# Patient Record
Sex: Male | Born: 1995 | Race: White | Hispanic: Yes | Marital: Single
Health system: Southern US, Community
[De-identification: ages and names within clinical notes are randomized; demographics above are authoritative.]

## PROBLEM LIST (undated history)

## (undated) DIAGNOSIS — Z803 Family history of malignant neoplasm of breast: Secondary | ICD-10-CM

## (undated) DIAGNOSIS — Z800 Family history of malignant neoplasm of digestive organs: Secondary | ICD-10-CM

## (undated) HISTORY — DX: Family history of malignant neoplasm of breast: Z80.3

## (undated) HISTORY — DX: Family history of malignant neoplasm of digestive organs: Z80.0

---
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
## 2017-06-26 DIAGNOSIS — J029 Acute pharyngitis, unspecified: Secondary | ICD-10-CM | POA: Diagnosis not present

## 2017-09-06 DIAGNOSIS — Z23 Encounter for immunization: Secondary | ICD-10-CM | POA: Diagnosis not present

## 2018-08-08 DIAGNOSIS — Z23 Encounter for immunization: Secondary | ICD-10-CM | POA: Diagnosis not present

## 2018-11-24 ENCOUNTER — Other Ambulatory Visit: Payer: Self-pay

## 2018-11-26 ENCOUNTER — Inpatient Hospital Stay: Payer: BLUE CROSS/BLUE SHIELD | Attending: Genetic Counselor | Admitting: Genetic Counselor

## 2018-11-26 ENCOUNTER — Inpatient Hospital Stay: Payer: Self-pay

## 2018-11-26 ENCOUNTER — Encounter: Payer: Self-pay | Admitting: Genetic Counselor

## 2018-11-26 DIAGNOSIS — Z803 Family history of malignant neoplasm of breast: Secondary | ICD-10-CM | POA: Insufficient documentation

## 2018-11-26 DIAGNOSIS — Z800 Family history of malignant neoplasm of digestive organs: Secondary | ICD-10-CM

## 2018-11-26 DIAGNOSIS — Z1379 Encounter for other screening for genetic and chromosomal anomalies: Secondary | ICD-10-CM

## 2018-11-26 NOTE — Progress Notes (Signed)
REFERRING PROVIDER: No referring provider defined for this encounter.  PRIMARY PROVIDER:  No primary care provider on file.  PRIMARY REASON FOR VISIT:  1. Family history of breast cancer   2. Family history of pancreatic cancer      HISTORY OF PRESENT ILLNESS:   Ricky Woods, a 22 y.o. male, was seen for a [facility] cancer genetics consultation at the request of Dr. No ref. provider found due to a family history of cancer.  Ricky Woods presents to clinic today to discuss the possibility of a hereditary predisposition to cancer, genetic testing, and to further clarify his future cancer risks, as well as potential cancer risks for family members.   Ricky Woods is a 22 y.o. male with no personal history of cancer.  He is here with his mother and aunt to learn more about his mother's genetic test result.  CANCER HISTORY:   No history exists.     Past Medical History:  Diagnosis Date  . Family history of breast cancer   . Family history of pancreatic cancer     Social History   Socioeconomic History  . Marital status: Single    Spouse name: Not on file  . Number of children: Not on file  . Years of education: Not on file  . Highest education level: Not on file  Occupational History  . Not on file  Social Needs  . Financial resource strain: Not on file  . Food insecurity:    Worry: Not on file    Inability: Not on file  . Transportation needs:    Medical: Not on file    Non-medical: Not on file  Tobacco Use  . Smoking status: Not on file  Substance and Sexual Activity  . Alcohol use: Not on file  . Drug use: Not on file  . Sexual activity: Not on file  Lifestyle  . Physical activity:    Days per week: Not on file    Minutes per session: Not on file  . Stress: Not on file  Relationships  . Social connections:    Talks on phone: Not on file    Gets together: Not on file    Attends religious service: Not on file    Active member of club or  organization: Not on file    Attends meetings of clubs or organizations: Not on file    Relationship status: Not on file  Other Topics Concern  . Not on file  Social History Narrative  . Not on file     FAMILY HISTORY:  We obtained a detailed, 4-generation family history.  Significant diagnoses are listed below: Family History  Problem Relation Age of Onset  . Breast cancer Mother 9  . Kidney cancer Maternal Uncle 61       d. 65  . Pancreatic cancer Maternal Grandmother 52  . Heart attack Maternal Grandfather   . Breast cancer Paternal Grandmother        dx in her 21s, and then a second time as well  . Pancreatic cancer Paternal Grandfather     The patient does not have children or siblings.  His parents are both living.    The patient's mother was diagnosed with breast cancer at 54.  She had genetic testing which found multiple VUS and a likely pathogenic mutation in [place], which is causative for Hereditary Leiomyomatosis and Renal Cell Carcinoma.  She has a full brother and sister and two paternal half sisters.  Her brother  died of kidney cancer at age 30.  The patient's maternal grandmother died of pancreatic cancer and the grandfather died of a heart attack.  The patient's father is living and healthy.  He has two brothers and sister and a paternal half sister.  None have cancer.  The patient's mother developed breast cancer in her 41's and again later in life and died at 2.  The grandfather died at 9 with pancreatic cancer.  Patient's maternal ancestors are of [place] descent, and paternal ancestors are of [place] descent. There is no reported Ashkenazi Jewish ancestry. There is no known consanguinity.  GENETIC COUNSELING ASSESSMENT: Ricky Woods is a 22 y.o. 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 We, therefore, discussed and recommended the following at today's visit.   DISCUSSION: Hereditary leiomyomatosis and renal cell cancer (HLRCC) is acondition that causesaffected individuals to develop benign leiomyomas in the skin and the uterus (fibroids). This condition also increases the risk ofrenal cancer, specifically Papillary type 2 renal cancer.  Cutaneous leiomyomas- typically develop in the third decade of life. These arebenign lesions that can be painful forsome individuals. The number of cutaneous leiomyomas varies greatly among individuals but tends to increase in size and number over time.   Uterine Leiomyomas-Most women with HLRCC also develop uterine leiomyomas (fibroids). While uterine fibroids are very common in the general population, women with HLRCC tend to have numerous large fibroids that appear earlier than in the general population.  Approximately10%-16%percent of people with HLRCC develop renal cell cancer. The average age of diagnosis of renal cancer in people with HLRCCis in their forties, however diagnosis as early as childhood has been reported.Most tumorsseen with this condition aretype 2 papillary renal cancer. However, other types of renal tumorshave been reportedincluding tubulo-papillary renal cell carcinomasandcollecting-duct renal cell carcinomas.The renal cancers described in [place] patients also tend to be more aggressive.  It is important that all of Ms. Woods'srelatives (both men and women) know of the presence of this gene mutation. Site-specific genetic testing can sort out who in thefamily is at risk and who is not.Ricky Woods has a 50% chance of having this same FH mutation.  If he tests positive we would want to start to image his kidneys to screen for kidney cancer.  We also discussed the family history of breast and pancreatic cancer on the paternal side of the family.  We discussed that about  5-10% of breast cancer and 5-10% of pancreatic cancer is hereditary.  In both cases, the most common cause of the cancer is due to genetic changes in BRCA1 or BRCA2.  There are other genes that can increase the risk for either cancer.  Based on the young age of onset of breast cancer in his grandmother, Ricky Woods is eligible for genetic testing for hereditary breast and pancreatic cancer syndromes, in addition to the Woods mutation identified in his mother.  Even though he is eligible, he is not the most informative person in the family to be tested.  His father would be a better person to test as he is closer in relation to the grandparents, to learn about hereditary breast and pancreatic cancer syndromes.  Ricky Woods would still need to be tested for FH in order to learn about his risk for HLRCC.  We reviewed the characteristics, features and inheritance patterns of hereditary  cancer syndromes. We also discussed genetic testing, including the appropriate family members to test, the process of testing, insurance coverage and turn-around-time for results. We discussed the implications of a negative, positive and/or variant of uncertain significant result. We recommended Ricky Woods pursue genetic testing for the multi cancer gene panel. The Multi-Gene Panel offered by [facility] includes sequencing and/or deletion duplication testing of the following 84 genes: AIP, ALK, APC, ATM, AXIN2,BAP1,  BARD1, BLM, BMPR1A, BRCA1, BRCA2, BRIP1, CASR, CDC73, CDH1, CDK4, CDKN1B, CDKN1C, CDKN2A (p14ARF), CDKN2A (p16INK4a), CEBPA, CHEK2, CTNNA1, DICER1, DIS3L2, EGFR (c.2369C>T, p.Thr790Met variant only), EPCAM (Deletion/duplication testing only), FH, FLCN, GATA2, GPC3, GREM1 (Promoter region deletion/duplication testing only), HOXB13 (c.251G>A, p.Gly84Glu), HRAS, KIT, MAX, MEN1, MET, MITF (c.952G>A, p.Glu318Lys variant only), MLH1, MSH2, MSH3, MSH6, MUTYH, NBN, NF1, NF2, NTHL1, PALB2, PDGFRA, PHOX2B, PMS2, POLD1,  POLE, POT1, PRKAR1A, PTCH1, PTEN, RAD50, RAD51C, RAD51D, RB1, RECQL4, RET, RUNX1, SDHAF2, SDHA (sequence changes only), SDHB, SDHC, SDHD, SMAD4, SMARCA4, SMARCB1, SMARCE1, STK11, SUFU, TERC, TERT, TMEM127, TP53, TSC1, TSC2, VHL, WRN and WT1.    Alternatively, he could be tested for the FH mutation as well as the VUS identified in his mother.  We discussed that some people do not want to undergo genetic testing due to fear of genetic discrimination.  A federal law called the Genetic Information Non-Discrimination Act (GINA) of 2008 helps protect individuals against genetic discrimination based on their genetic test results.  It impacts both health insurance and employment.  With health insurance, it protects against increased premiums, being kicked off insurance or being forced to take a test in order to be insured.  For employment it protects against hiring, firing and promoting decisions based on genetic test results.  Health status due to a cancer diagnosis is not protected under GINA.   Based on Ricky Woods's family history of cancer, he meets medical criteria for genetic testing. If he undergoes genetic testing for the FH mutation and the VUS's that were identified in his mother, genetic testing will be covered by the testing laboratory under their family testing policy.  If he decides to be tested for the cancers on his paternal side of the family as well, this would need to go through insurance.  Despite that he meets criteria, he may still have an out of pocket cost. We discussed that if his out of pocket cost for testing is over $100, the laboratory will call and confirm whether he wants to proceed with testing.  If the out of pocket cost of testing is less than $100 he will be billed by the genetic testing laboratory.   PLAN: Despite our recommendation, Ricky Woods did not wish to pursue genetic testing at today's visit. He understands that his testing will be free for his mother's FH  mutation.  He feels that he needs to get more information about his paternal family history and consider the risks based on insurance before proceeding.  We understand this decision, and remain available to coordinate genetic testing at any time in the future. We, therefore, recommend Ricky Woods continue to follow the cancer screening guidelines given by his primary healthcare provider.  Based on Mr. Pavel family history, we recommended his father have genetic counseling and possible testing based on his family history of cancer. Mr. Deleeuw will let us know if we can be of any assistance in coordinating genetic counseling and/or testing for this family member.   Lastly, we encouraged Mr. Sorlie to remain in contact with cancer genetics annually so that  we can continuously update the family history and inform him of any changes in cancer genetics and testing that may be of benefit for this family.   Mr.  Goulette questions were answered to his satisfaction today. Our contact information was provided should additional questions or concerns arise. Thank you for the referral and allowing [place] to share in the care of your patient.   Tenlee Wollin P. Florene Glen, [place], [facility] Certified Genetic Counselor [email or web address] phone: [phone]  The patient was seen for a total of 40 minutes in face-to-face genetic counseling.  This patient was discussed with Drs. Magrinat, Lindi Adie and/or Burr Medico who agrees with the above.    _______________________________________________________________________ For Office Staff:  Number of people involved in session: 3 Was an Intern/ student involved with case: no

## 2018-12-24 DIAGNOSIS — H6120 Impacted cerumen, unspecified ear: Secondary | ICD-10-CM | POA: Diagnosis not present

## 2019-04-30 DIAGNOSIS — K143 Hypertrophy of tongue papillae: Secondary | ICD-10-CM | POA: Diagnosis not present

## 2019-04-30 DIAGNOSIS — K122 Cellulitis and abscess of mouth: Secondary | ICD-10-CM | POA: Diagnosis not present

## 2019-04-30 DIAGNOSIS — R110 Nausea: Secondary | ICD-10-CM | POA: Diagnosis not present
